# Patient Record
Sex: Male | Born: 1990 | Race: White | Hispanic: No | Marital: Married | Smoking: Current every day smoker
Health system: Southern US, Community
[De-identification: ages and names within clinical notes are randomized; demographics above are authoritative.]

## PROBLEM LIST (undated history)

## (undated) DIAGNOSIS — K219 Gastro-esophageal reflux disease without esophagitis: Secondary | ICD-10-CM

## (undated) DIAGNOSIS — F419 Anxiety disorder, unspecified: Secondary | ICD-10-CM

## (undated) DIAGNOSIS — F32A Depression, unspecified: Secondary | ICD-10-CM

## (undated) DIAGNOSIS — F1910 Other psychoactive substance abuse, uncomplicated: Secondary | ICD-10-CM

## (undated) HISTORY — PX: NO PAST SURGERIES: SHX2092

---

## 2013-09-26 ENCOUNTER — Emergency Department: Payer: Self-pay | Admitting: Emergency Medicine

## 2013-09-26 LAB — CBC
HCT: 47.8 % (ref 40.0–52.0)
HGB: 16.9 g/dL (ref 13.0–18.0)
MCH: 31.6 pg (ref 26.0–34.0)
MCHC: 35.4 g/dL (ref 32.0–36.0)
MCV: 89 fL (ref 80–100)
RBC: 5.36 10*6/uL (ref 4.40–5.90)
RDW: 13 % (ref 11.5–14.5)

## 2013-09-26 LAB — COMPREHENSIVE METABOLIC PANEL
Albumin: 4.2 g/dL (ref 3.4–5.0)
Anion Gap: 5 — ABNORMAL LOW (ref 7–16)
BUN: 14 mg/dL (ref 7–18)
Bilirubin,Total: 0.3 mg/dL (ref 0.2–1.0)
Calcium, Total: 9.4 mg/dL (ref 8.5–10.1)
Chloride: 106 mmol/L (ref 98–107)
Co2: 28 mmol/L (ref 21–32)
Creatinine: 1.07 mg/dL (ref 0.60–1.30)
EGFR (African American): >60
EGFR (Non-African Amer.): >60
Glucose: 111 mg/dL — ABNORMAL HIGH (ref 65–99)
Potassium: 3.6 mmol/L (ref 3.5–5.1)
Sodium: 139 mmol/L (ref 136–145)
Total Protein: 7.5 g/dL (ref 6.4–8.2)

## 2013-09-26 LAB — ETHANOL: Ethanol %: <0.003 % (ref 0.000–0.080)

## 2013-09-26 LAB — SALICYLATE LEVEL: Salicylates, Serum: 5.1 mg/dL — ABNORMAL HIGH

## 2018-06-12 ENCOUNTER — Other Ambulatory Visit: Payer: Self-pay

## 2018-06-12 DIAGNOSIS — Y929 Unspecified place or not applicable: Secondary | ICD-10-CM | POA: Insufficient documentation

## 2018-06-12 DIAGNOSIS — W228XXA Striking against or struck by other objects, initial encounter: Secondary | ICD-10-CM | POA: Insufficient documentation

## 2018-06-12 DIAGNOSIS — Z23 Encounter for immunization: Secondary | ICD-10-CM | POA: Insufficient documentation

## 2018-06-12 DIAGNOSIS — Y998 Other external cause status: Secondary | ICD-10-CM | POA: Insufficient documentation

## 2018-06-12 DIAGNOSIS — Y9389 Activity, other specified: Secondary | ICD-10-CM | POA: Insufficient documentation

## 2018-06-12 DIAGNOSIS — S61412A Laceration without foreign body of left hand, initial encounter: Secondary | ICD-10-CM | POA: Insufficient documentation
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
## 2018-06-13 ENCOUNTER — Emergency Department
Admission: EM | Admit: 2018-06-13 | Discharge: 2018-06-13 | Payer: Self-pay | Attending: Emergency Medicine | Admitting: Emergency Medicine

## 2018-06-13 ENCOUNTER — Emergency Department: Payer: Self-pay

## 2018-06-13 DIAGNOSIS — S61412A Laceration without foreign body of left hand, initial encounter: Secondary | ICD-10-CM

## 2018-06-13 MED ORDER — LIDOCAINE HCL (PF) 1 % IJ SOLN
INTRAMUSCULAR | Status: AC
  Filled 2018-06-13: qty 10

## 2018-06-13 MED ORDER — TETANUS-DIPHTH-ACELL PERTUSSIS 5-2.5-18.5 LF-MCG/0.5 IM SUSP
0.5000 mL | Freq: Once | INTRAMUSCULAR | Status: AC
Start: 2018-06-13 — End: 2018-06-13
  Administered 2018-06-13: 0.5 mL via INTRAMUSCULAR
  Filled 2018-06-13: qty 0.5

## 2018-06-13 MED ORDER — LIDOCAINE HCL (PF) 1 % IJ SOLN
30.0000 mL | Freq: Once | INTRAMUSCULAR | Status: AC
  Administered 2018-06-13: 30 mL via INTRADERMAL
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
## 2018-06-13 NOTE — Discharge Instructions (Addendum)
1.  Your tetanus was updated and will be good for the next 10 years. 2.  Suture removal in 7 to 10 days. 3.  Return to the ER for worsening symptoms, increased redness/swelling, purulent discharge or other concerns.
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
## 2018-10-27 DIAGNOSIS — U071 COVID-19: Secondary | ICD-10-CM

## 2018-10-27 HISTORY — DX: COVID-19: U07.1
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
## 2019-09-21 ENCOUNTER — Other Ambulatory Visit: Payer: Self-pay

## 2019-09-21 DIAGNOSIS — Z20822 Contact with and (suspected) exposure to covid-19: Secondary | ICD-10-CM

## 2019-09-23 LAB — NOVEL CORONAVIRUS, NAA: SARS-CoV-2, NAA: NOT DETECTED

## 2022-05-12 ENCOUNTER — Ambulatory Visit (INDEPENDENT_AMBULATORY_CARE_PROVIDER_SITE_OTHER): Payer: Medicaid Other

## 2022-05-12 ENCOUNTER — Ambulatory Visit
Admission: EM | Admit: 2022-05-12 | Discharge: 2022-05-12 | Disposition: A | Discharge status: Home / Self Care | Payer: Medicaid Other | Attending: Physician Assistant | Admitting: Physician Assistant

## 2022-05-12 DIAGNOSIS — R0781 Pleurodynia: Secondary | ICD-10-CM

## 2022-05-12 DIAGNOSIS — S2242XA Multiple fractures of ribs, left side, initial encounter for closed fracture: Secondary | ICD-10-CM
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
## 2022-05-12 NOTE — Discharge Instructions (Addendum)
-  You have fractures of the fifth, sixth and seventh ribs.  This generally heal on its own over a couple of weeks.  Sounds like they are already healing but it will probably be a couple more weeks until it heals. - Continue to ice the area and use the compressive wrap as well as take ibuprofen and Tylenol. - If pain worsens or you feel short of breath, you need to call 911 or have someone take you to the ER. - Follow-up with your PCP.
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
## 2022-10-07 ENCOUNTER — Emergency Department (HOSPITAL_COMMUNITY): Payer: Medicaid Other

## 2022-10-07 ENCOUNTER — Emergency Department (HOSPITAL_COMMUNITY)
Admission: EM | Admit: 2022-10-07 | Discharge: 2022-10-08 | Disposition: A | Discharge status: Home / Self Care | Payer: Medicaid Other | Attending: Emergency Medicine | Admitting: Emergency Medicine

## 2022-10-07 DIAGNOSIS — F10129 Alcohol abuse with intoxication, unspecified: Secondary | ICD-10-CM | POA: Insufficient documentation

## 2022-10-07 DIAGNOSIS — S42201A Unspecified fracture of upper end of right humerus, initial encounter for closed fracture: Secondary | ICD-10-CM | POA: Insufficient documentation

## 2022-10-07 DIAGNOSIS — Y92410 Unspecified street and highway as the place of occurrence of the external cause: Secondary | ICD-10-CM | POA: Insufficient documentation

## 2022-10-07 DIAGNOSIS — Y905 Blood alcohol level of 100-119 mg/100 ml: Secondary | ICD-10-CM | POA: Diagnosis not present

## 2022-10-07 DIAGNOSIS — S2231XA Fracture of one rib, right side, initial encounter for closed fracture: Secondary | ICD-10-CM | POA: Diagnosis not present

## 2022-10-07 DIAGNOSIS — S32592A Other specified fracture of left pubis, initial encounter for closed fracture: Secondary | ICD-10-CM | POA: Diagnosis not present

## 2022-10-07 DIAGNOSIS — Z23 Encounter for immunization: Secondary | ICD-10-CM | POA: Insufficient documentation

## 2022-10-07 DIAGNOSIS — Y9355 Activity, bike riding: Secondary | ICD-10-CM | POA: Insufficient documentation

## 2022-10-07 DIAGNOSIS — S0990XA Unspecified injury of head, initial encounter: Secondary | ICD-10-CM | POA: Insufficient documentation

## 2022-10-07 DIAGNOSIS — S4991XA Unspecified injury of right shoulder and upper arm, initial encounter: Secondary | ICD-10-CM | POA: Diagnosis present

## 2022-10-07 DIAGNOSIS — S42309A Unspecified fracture of shaft of humerus, unspecified arm, initial encounter for closed fracture: Secondary | ICD-10-CM | POA: Diagnosis present

## 2022-10-07 DIAGNOSIS — F17210 Nicotine dependence, cigarettes, uncomplicated: Secondary | ICD-10-CM | POA: Diagnosis not present

## 2022-10-07 LAB — URINALYSIS, ROUTINE W REFLEX MICROSCOPIC
Bilirubin Urine: NEGATIVE
Glucose, UA: NEGATIVE mg/dL
Hgb urine dipstick: NEGATIVE
Ketones, ur: NEGATIVE mg/dL
Leukocytes,Ua: NEGATIVE
Nitrite: NEGATIVE
Protein, ur: NEGATIVE mg/dL
Specific Gravity, Urine: 1.027 (ref 1.005–1.030)
pH: 5 (ref 5.0–8.0)

## 2022-10-07 LAB — CBC
HCT: 42.2 % (ref 39.0–52.0)
Hemoglobin: 15.2 g/dL (ref 13.0–17.0)
MCH: 33.5 pg (ref 26.0–34.0)
MCHC: 36 g/dL (ref 30.0–36.0)
MCV: 93 fL (ref 80.0–100.0)
Platelets: 289 10*3/uL (ref 150–400)
RBC: 4.54 MIL/uL (ref 4.22–5.81)
RDW: 11.8 % (ref 11.5–15.5)
WBC: 10.1 10*3/uL (ref 4.0–10.5)
nRBC: 0 % (ref 0.0–0.2)

## 2022-10-07 LAB — COMPREHENSIVE METABOLIC PANEL
ALT: 55 U/L — ABNORMAL HIGH (ref 0–44)
AST: 53 U/L — ABNORMAL HIGH (ref 15–41)
Albumin: 3.5 g/dL (ref 3.5–5.0)
Alkaline Phosphatase: 93 U/L (ref 38–126)
Anion gap: 16 — ABNORMAL HIGH (ref 5–15)
BUN: 11 mg/dL (ref 6–20)
CO2: 20 mmol/L — ABNORMAL LOW (ref 22–32)
Calcium: 9.1 mg/dL (ref 8.9–10.3)
Chloride: 99 mmol/L (ref 98–111)
Creatinine, Ser: 0.95 mg/dL (ref 0.61–1.24)
GFR, Estimated: >60 mL/min (ref >60)
Glucose, Bld: 134 mg/dL — ABNORMAL HIGH (ref 70–99)
Potassium: 3.3 mmol/L — ABNORMAL LOW (ref 3.5–5.1)
Sodium: 135 mmol/L (ref 135–145)
Total Bilirubin: 0.5 mg/dL (ref 0.3–1.2)
Total Protein: 6.6 g/dL (ref 6.5–8.1)

## 2022-10-07 LAB — I-STAT CHEM 8, ED
BUN: 11 mg/dL (ref 6–20)
Calcium, Ion: 1.16 mmol/L (ref 1.15–1.40)
Chloride: 98 mmol/L (ref 98–111)
Creatinine, Ser: 1.1 mg/dL (ref 0.61–1.24)
Glucose, Bld: 128 mg/dL — ABNORMAL HIGH (ref 70–99)
HCT: 44 % (ref 39.0–52.0)
Hemoglobin: 15 g/dL (ref 13.0–17.0)
Potassium: 3.3 mmol/L — ABNORMAL LOW (ref 3.5–5.1)
Sodium: 136 mmol/L (ref 135–145)
TCO2: 24 mmol/L (ref 22–32)

## 2022-10-07 LAB — LACTIC ACID, PLASMA: Lactic Acid, Venous: 2.9 mmol/L (ref 0.5–1.9)

## 2022-10-07 LAB — TYPE AND SCREEN
ABO/RH(D): A POS
Antibody Screen: NEGATIVE

## 2022-10-07 LAB — PROTIME-INR
INR: 1 (ref 0.8–1.2)
Prothrombin Time: 13.2 seconds (ref 11.4–15.2)

## 2022-10-07 LAB — ETHANOL: Alcohol, Ethyl (B): 113 mg/dL — ABNORMAL HIGH (ref <10)

## 2022-10-07 MED ORDER — HYDROMORPHONE HCL 1 MG/ML IJ SOLN
0.5000 mg | Freq: Once | INTRAMUSCULAR | Status: AC
  Administered 2022-10-07: 0.5 mg via INTRAVENOUS
  Filled 2022-10-07: qty 1

## 2022-10-07 MED ORDER — LACTATED RINGERS IV BOLUS
1000.0000 mL | Freq: Once | INTRAVENOUS | Status: AC
  Administered 2022-10-07: 1000 mL via INTRAVENOUS

## 2022-10-07 MED ORDER — METOPROLOL TARTRATE 5 MG/5ML IV SOLN: 5.0000 mg | Freq: Four times a day (QID) | INTRAVENOUS | Status: DC | PRN

## 2022-10-07 MED ORDER — ONDANSETRON 4 MG PO TBDP: 4.0000 mg | ORAL_TABLET | Freq: Four times a day (QID) | ORAL | Status: DC | PRN

## 2022-10-07 MED ORDER — METHOCARBAMOL 500 MG PO TABS
500.0000 mg | ORAL_TABLET | Freq: Four times a day (QID) | ORAL | Status: DC | PRN
  Administered 2022-10-07: 500 mg via ORAL
  Filled 2022-10-07: qty 1

## 2022-10-07 MED ORDER — GABAPENTIN 300 MG PO CAPS
300.0000 mg | ORAL_CAPSULE | Freq: Three times a day (TID) | ORAL | Status: DC
  Administered 2022-10-07 – 2022-10-08 (×3): 300 mg via ORAL
  Filled 2022-10-07 (×3): qty 1

## 2022-10-07 MED ORDER — BISACODYL 10 MG RE SUPP: 10.0000 mg | Freq: Every day | RECTAL | Status: DC | PRN

## 2022-10-07 MED ORDER — ACETAMINOPHEN 500 MG PO TABS
1000.0000 mg | ORAL_TABLET | Freq: Four times a day (QID) | ORAL | Status: DC
  Administered 2022-10-07 – 2022-10-08 (×4): 1000 mg via ORAL
  Filled 2022-10-07 (×4): qty 2

## 2022-10-07 MED ORDER — HYDRALAZINE HCL 20 MG/ML IJ SOLN: 10.0000 mg | INTRAMUSCULAR | Status: DC | PRN

## 2022-10-07 MED ORDER — FENTANYL CITRATE PF 50 MCG/ML IJ SOSY
100.0000 ug | PREFILLED_SYRINGE | Freq: Once | INTRAMUSCULAR | Status: AC
  Administered 2022-10-07: 100 ug via INTRAVENOUS
  Filled 2022-10-07: qty 2

## 2022-10-07 MED ORDER — IOHEXOL 350 MG/ML SOLN
75.0000 mL | Freq: Once | INTRAVENOUS | Status: AC | PRN
  Administered 2022-10-07: 75 mL via INTRAVENOUS

## 2022-10-07 MED ORDER — TETANUS-DIPHTH-ACELL PERTUSSIS 5-2.5-18.5 LF-MCG/0.5 IM SUSY
0.5000 mL | PREFILLED_SYRINGE | Freq: Once | INTRAMUSCULAR | Status: AC
  Administered 2022-10-07: 0.5 mL via INTRAMUSCULAR
  Filled 2022-10-07: qty 0.5

## 2022-10-07 MED ORDER — DOCUSATE SODIUM 100 MG PO CAPS
100.0000 mg | ORAL_CAPSULE | Freq: Two times a day (BID) | ORAL | Status: DC
  Administered 2022-10-07 – 2022-10-08 (×2): 100 mg via ORAL
  Filled 2022-10-07 (×2): qty 1

## 2022-10-07 MED ORDER — IBUPROFEN 800 MG PO TABS
800.0000 mg | ORAL_TABLET | Freq: Three times a day (TID) | ORAL | Status: DC
  Administered 2022-10-07 – 2022-10-08 (×3): 800 mg via ORAL
  Filled 2022-10-07 (×3): qty 1

## 2022-10-07 MED ORDER — ENOXAPARIN SODIUM 30 MG/0.3ML IJ SOSY: 30.0000 mg | PREFILLED_SYRINGE | Freq: Two times a day (BID) | INTRAMUSCULAR | Status: DC

## 2022-10-07 MED ORDER — ONDANSETRON HCL 4 MG/2ML IJ SOLN
4.0000 mg | Freq: Once | INTRAMUSCULAR | Status: AC
  Administered 2022-10-07: 4 mg via INTRAVENOUS
  Filled 2022-10-07: qty 2

## 2022-10-07 MED ORDER — THIAMINE HCL 100 MG/ML IJ SOLN
100.0000 mg | Freq: Once | INTRAMUSCULAR | Status: AC
  Administered 2022-10-07: 100 mg via INTRAVENOUS
  Filled 2022-10-07: qty 2

## 2022-10-07 MED ORDER — BACITRACIN ZINC 500 UNIT/GM EX OINT
TOPICAL_OINTMENT | Freq: Two times a day (BID) | CUTANEOUS | Status: DC
  Administered 2022-10-07 – 2022-10-08 (×2): 1 via TOPICAL
  Filled 2022-10-07: qty 0.9

## 2022-10-07 MED ORDER — ONDANSETRON HCL 4 MG/2ML IJ SOLN: 4.0000 mg | Freq: Four times a day (QID) | INTRAMUSCULAR | Status: DC | PRN

## 2022-10-07 MED ORDER — HYDROMORPHONE HCL 1 MG/ML IJ SOLN
0.5000 mg | INTRAMUSCULAR | Status: DC | PRN
  Administered 2022-10-08: 0.5 mg via INTRAVENOUS
  Filled 2022-10-07: qty 1

## 2022-10-07 MED ORDER — SODIUM CHLORIDE 0.9 % IV SOLN: INTRAVENOUS | Status: DC

## 2022-10-07 MED ORDER — OXYCODONE HCL 5 MG PO TABS
5.0000 mg | ORAL_TABLET | ORAL | Status: DC | PRN
  Administered 2022-10-07 – 2022-10-08 (×3): 5 mg via ORAL
  Filled 2022-10-07 (×3): qty 1
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
## 2022-10-07 NOTE — ED Notes (Signed)
Ortho tech is coming to place shoulder sling/immobilizer.
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
## 2022-10-07 NOTE — Progress Notes (Signed)
Orthopedic Tech Progress Note Patient Details:  Alejandro Cooper 1991-09-16 [id number]  Ortho Devices Type of Ortho Device: Sling immobilizer Ortho Device/Splint Location: RUE Ortho Device/Splint Interventions: Ordered, Application   Post Interventions Patient Tolerated: Well  Alejandro Cooper 10/07/2022, 4:56 PM
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
## 2022-10-07 NOTE — ED Provider Notes (Signed)
Ultrasound ED FAST  Date/Time: 10/07/2022 2:41 PM  Performed by: Jeannie Fend, PA-C Authorized by: Jeannie Fend, PA-C  Procedure details:    Indications: blunt abdominal trauma        Technique:  Abdominal    Images: not archived      Abdominal findings:    L kidney:  Visualized   R kidney:  Visualized   Liver:  Visualized    Bladder:  Visualized   Hepatorenal space visualized: not identified     Splenorenal space: not identified     Rectovesical free fluid: not identified     Splenorenal free fluid: not identified     Hepatorenal space free fluid: not identified       Jeannie Fend, PA-C 10/07/22 1442    Wynetta Fines, MD 10/07/22 1521

## 2022-10-08 ENCOUNTER — Observation Stay (HOSPITAL_COMMUNITY): Payer: Medicaid Other

## 2022-10-08 LAB — BASIC METABOLIC PANEL
Anion gap: 8 (ref 5–15)
BUN: 7 mg/dL (ref 6–20)
CO2: 25 mmol/L (ref 22–32)
Calcium: 8.4 mg/dL — ABNORMAL LOW (ref 8.9–10.3)
Chloride: 102 mmol/L (ref 98–111)
Creatinine, Ser: 0.81 mg/dL (ref 0.61–1.24)
GFR, Estimated: >60 mL/min (ref >60)
Glucose, Bld: 107 mg/dL — ABNORMAL HIGH (ref 70–99)
Potassium: 3.2 mmol/L — ABNORMAL LOW (ref 3.5–5.1)
Sodium: 135 mmol/L (ref 135–145)

## 2022-10-08 LAB — CBC
HCT: 38.1 % — ABNORMAL LOW (ref 39.0–52.0)
Hemoglobin: 13.5 g/dL (ref 13.0–17.0)
MCH: 32.9 pg (ref 26.0–34.0)
MCHC: 35.4 g/dL (ref 30.0–36.0)
MCV: 92.9 fL (ref 80.0–100.0)
Platelets: 248 10*3/uL (ref 150–400)
RBC: 4.1 MIL/uL — ABNORMAL LOW (ref 4.22–5.81)
RDW: 11.6 % (ref 11.5–15.5)
WBC: 6.8 10*3/uL (ref 4.0–10.5)
nRBC: 0 % (ref 0.0–0.2)

## 2022-10-08 LAB — HIV ANTIBODY (ROUTINE TESTING W REFLEX): HIV Screen 4th Generation wRfx: NONREACTIVE

## 2022-10-08 LAB — ABO/RH: ABO/RH(D): A POS

## 2022-10-08 MED ORDER — POTASSIUM CHLORIDE 10 MEQ/100ML IV SOLN
10.0000 meq | INTRAVENOUS | Status: AC
  Administered 2022-10-08 (×4): 10 meq via INTRAVENOUS
  Filled 2022-10-08 (×4): qty 100

## 2022-10-08 MED ORDER — OXYCODONE HCL 5 MG PO TABS: 5.0000 mg | ORAL_TABLET | Freq: Four times a day (QID) | ORAL | 0 refills | Status: DC | PRN

## 2022-10-08 MED ORDER — ACETAMINOPHEN 500 MG PO TABS: 1000.0000 mg | ORAL_TABLET | Freq: Four times a day (QID) | ORAL | 0 refills | Status: AC | PRN
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
## 2022-10-08 NOTE — ED Notes (Signed)
Reviewed discharge instructions with patient. Follow-up care and medications reviewed. Patient verbalized understanding. Patient A&Ox4, VSS upon discharge. 

## 2022-10-22 ENCOUNTER — Ambulatory Visit: Payer: Self-pay | Admitting: Student

## 2022-10-23 ENCOUNTER — Encounter (HOSPITAL_COMMUNITY): Payer: Self-pay | Admitting: Student

## 2022-10-23 ENCOUNTER — Other Ambulatory Visit: Payer: Self-pay

## 2022-10-23 NOTE — Progress Notes (Signed)
Spoke with pt's wife, Lelon Mast for pre-op call. She states pt does not have a cardiac history, HTN or Diabetes.   Shower instructions given to [place] and she voices understanding.

## 2022-10-24 ENCOUNTER — Ambulatory Visit (HOSPITAL_BASED_OUTPATIENT_CLINIC_OR_DEPARTMENT_OTHER): Payer: Medicaid Other | Admitting: Anesthesiology

## 2022-10-24 ENCOUNTER — Ambulatory Visit (HOSPITAL_COMMUNITY): Payer: Medicaid Other | Admitting: Anesthesiology

## 2022-10-24 ENCOUNTER — Encounter (HOSPITAL_COMMUNITY): Payer: Self-pay | Admitting: Student

## 2022-10-24 ENCOUNTER — Other Ambulatory Visit: Payer: Self-pay

## 2022-10-24 ENCOUNTER — Ambulatory Visit (HOSPITAL_COMMUNITY): Payer: Medicaid Other

## 2022-10-24 ENCOUNTER — Encounter (HOSPITAL_COMMUNITY)
Admission: RE | Disposition: A | Discharge status: Home / Self Care | Payer: Self-pay | Source: Home / Self Care | Attending: Student

## 2022-10-24 ENCOUNTER — Ambulatory Visit (HOSPITAL_COMMUNITY)
Admission: RE | Admit: 2022-10-24 | Discharge: 2022-10-24 | Disposition: A | Discharge status: Home / Self Care | Payer: Medicaid Other | Attending: Student | Admitting: Student

## 2022-10-24 DIAGNOSIS — F17210 Nicotine dependence, cigarettes, uncomplicated: Secondary | ICD-10-CM

## 2022-10-24 DIAGNOSIS — S32592A Other specified fracture of left pubis, initial encounter for closed fracture: Secondary | ICD-10-CM | POA: Diagnosis not present

## 2022-10-24 DIAGNOSIS — S42201A Unspecified fracture of upper end of right humerus, initial encounter for closed fracture: Secondary | ICD-10-CM | POA: Diagnosis not present

## 2022-10-24 DIAGNOSIS — K219 Gastro-esophageal reflux disease without esophagitis: Secondary | ICD-10-CM | POA: Insufficient documentation

## 2022-10-24 HISTORY — DX: Gastro-esophageal reflux disease without esophagitis: K21.9

## 2022-10-24 HISTORY — PX: ORIF HUMERUS FRACTURE: SHX2126

## 2022-10-24 HISTORY — DX: Other psychoactive substance abuse, uncomplicated: F19.10

## 2022-10-24 HISTORY — DX: Anxiety disorder, unspecified: F41.9

## 2022-10-24 HISTORY — DX: Depression, unspecified: F32.A

## 2022-10-24 SURGERY — OPEN REDUCTION INTERNAL FIXATION (ORIF) PROXIMAL HUMERUS FRACTURE
Anesthesia: General | Site: Arm Upper | Laterality: Right

## 2022-10-24 MED ORDER — HYDROCODONE-ACETAMINOPHEN 7.5-325 MG PO TABS: 1.0000 | ORAL_TABLET | ORAL | 0 refills | Status: DC | PRN

## 2022-10-24 MED ORDER — LIDOCAINE 2% (20 MG/ML) 5 ML SYRINGE
INTRAMUSCULAR | Status: DC | PRN
  Administered 2022-10-24: 100 mg via INTRAVENOUS

## 2022-10-24 MED ORDER — OXYCODONE HCL 5 MG/5ML PO SOLN: 5.0000 mg | Freq: Once | ORAL | Status: AC | PRN

## 2022-10-24 MED ORDER — BUPIVACAINE HCL (PF) 0.5 % IJ SOLN
INTRAMUSCULAR | Status: DC | PRN
  Administered 2022-10-24: 20 mL via PERINEURAL

## 2022-10-24 MED ORDER — MIDAZOLAM HCL 2 MG/2ML IJ SOLN: 2.0000 mg | Freq: Once | INTRAMUSCULAR | Status: AC

## 2022-10-24 MED ORDER — 0.9 % SODIUM CHLORIDE (POUR BTL) OPTIME
TOPICAL | Status: DC | PRN
  Administered 2022-10-24: 1000 mL

## 2022-10-24 MED ORDER — KETOROLAC TROMETHAMINE 30 MG/ML IJ SOLN
INTRAMUSCULAR | Status: AC
  Filled 2022-10-24: qty 1

## 2022-10-24 MED ORDER — SUGAMMADEX SODIUM 200 MG/2ML IV SOLN
INTRAVENOUS | Status: DC | PRN
  Administered 2022-10-24: 200 mg via INTRAVENOUS

## 2022-10-24 MED ORDER — FENTANYL CITRATE (PF) 100 MCG/2ML IJ SOLN
INTRAMUSCULAR | Status: AC
  Administered 2022-10-24: 100 ug via INTRAVENOUS
  Filled 2022-10-24: qty 2

## 2022-10-24 MED ORDER — LIDOCAINE 2% (20 MG/ML) 5 ML SYRINGE
INTRAMUSCULAR | Status: AC
  Filled 2022-10-24: qty 5

## 2022-10-24 MED ORDER — HYDROMORPHONE HCL 1 MG/ML IJ SOLN: 0.2500 mg | INTRAMUSCULAR | Status: DC | PRN

## 2022-10-24 MED ORDER — METHOCARBAMOL 500 MG PO TABS: 500.0000 mg | ORAL_TABLET | Freq: Four times a day (QID) | ORAL | 0 refills | Status: AC | PRN

## 2022-10-24 MED ORDER — FENTANYL CITRATE (PF) 250 MCG/5ML IJ SOLN
INTRAMUSCULAR | Status: AC
  Filled 2022-10-24: qty 5

## 2022-10-24 MED ORDER — ORAL CARE MOUTH RINSE: 15.0000 mL | Freq: Once | OROMUCOSAL | Status: AC

## 2022-10-24 MED ORDER — KETOROLAC TROMETHAMINE 30 MG/ML IJ SOLN
30.0000 mg | Freq: Once | INTRAMUSCULAR | Status: AC | PRN
  Administered 2022-10-24: 30 mg via INTRAVENOUS

## 2022-10-24 MED ORDER — MIDAZOLAM HCL 2 MG/2ML IJ SOLN
INTRAMUSCULAR | Status: DC | PRN
  Administered 2022-10-24: 2 mg via INTRAVENOUS

## 2022-10-24 MED ORDER — VANCOMYCIN HCL 1000 MG IV SOLR
INTRAVENOUS | Status: DC | PRN
  Administered 2022-10-24: 1000 mg via TOPICAL

## 2022-10-24 MED ORDER — MIDAZOLAM HCL 2 MG/2ML IJ SOLN
INTRAMUSCULAR | Status: AC
  Administered 2022-10-24: 2 mg via INTRAVENOUS
  Filled 2022-10-24: qty 2

## 2022-10-24 MED ORDER — PROPOFOL 10 MG/ML IV BOLUS
INTRAVENOUS | Status: AC
  Filled 2022-10-24: qty 20

## 2022-10-24 MED ORDER — FENTANYL CITRATE (PF) 100 MCG/2ML IJ SOLN: 100.0000 ug | Freq: Once | INTRAMUSCULAR | Status: AC

## 2022-10-24 MED ORDER — OXYCODONE HCL 5 MG PO TABS
5.0000 mg | ORAL_TABLET | Freq: Once | ORAL | Status: AC | PRN
  Administered 2022-10-24: 5 mg via ORAL

## 2022-10-24 MED ORDER — DEXAMETHASONE SODIUM PHOSPHATE 10 MG/ML IJ SOLN
INTRAMUSCULAR | Status: DC | PRN
  Administered 2022-10-24: 10 mg via INTRAVENOUS

## 2022-10-24 MED ORDER — OXYCODONE HCL 5 MG PO TABS
ORAL_TABLET | ORAL | Status: AC
  Filled 2022-10-24: qty 1

## 2022-10-24 MED ORDER — FENTANYL CITRATE (PF) 250 MCG/5ML IJ SOLN
INTRAMUSCULAR | Status: DC | PRN
  Administered 2022-10-24: 50 ug via INTRAVENOUS
  Administered 2022-10-24: 100 ug via INTRAVENOUS
  Administered 2022-10-24: 50 ug via INTRAVENOUS

## 2022-10-24 MED ORDER — BUPIVACAINE LIPOSOME 1.3 % IJ SUSP
INTRAMUSCULAR | Status: DC | PRN
  Administered 2022-10-24: 10 mL via PERINEURAL

## 2022-10-24 MED ORDER — VANCOMYCIN HCL 1000 MG IV SOLR
INTRAVENOUS | Status: AC
  Filled 2022-10-24: qty 20

## 2022-10-24 MED ORDER — ROCURONIUM BROMIDE 10 MG/ML (PF) SYRINGE
PREFILLED_SYRINGE | INTRAVENOUS | Status: DC | PRN
  Administered 2022-10-24: 70 mg via INTRAVENOUS
  Administered 2022-10-24: 30 mg via INTRAVENOUS

## 2022-10-24 MED ORDER — PROPOFOL 10 MG/ML IV BOLUS
INTRAVENOUS | Status: DC | PRN
  Administered 2022-10-24: 150 mg via INTRAVENOUS

## 2022-10-24 MED ORDER — CEFAZOLIN SODIUM-DEXTROSE 2-4 GM/100ML-% IV SOLN
2.0000 g | INTRAVENOUS | Status: AC
  Administered 2022-10-24: 2 g via INTRAVENOUS
  Filled 2022-10-24: qty 100

## 2022-10-24 MED ORDER — DEXMEDETOMIDINE HCL IN NACL 80 MCG/20ML IV SOLN
INTRAVENOUS | Status: DC | PRN
  Administered 2022-10-24: 52 ug via BUCCAL

## 2022-10-24 MED ORDER — MIDAZOLAM HCL 2 MG/2ML IJ SOLN
INTRAMUSCULAR | Status: AC
  Filled 2022-10-24: qty 2

## 2022-10-24 MED ORDER — SUCCINYLCHOLINE CHLORIDE 200 MG/10ML IV SOSY
PREFILLED_SYRINGE | INTRAVENOUS | Status: AC
  Filled 2022-10-24: qty 10

## 2022-10-24 MED ORDER — ONDANSETRON HCL 4 MG/2ML IJ SOLN
INTRAMUSCULAR | Status: DC | PRN
  Administered 2022-10-24: 4 mg via INTRAVENOUS

## 2022-10-24 MED ORDER — ONDANSETRON HCL 4 MG/2ML IJ SOLN: 4.0000 mg | Freq: Once | INTRAMUSCULAR | Status: DC | PRN

## 2022-10-24 MED ORDER — CHLORHEXIDINE GLUCONATE 0.12 % MT SOLN
15.0000 mL | Freq: Once | OROMUCOSAL | Status: AC
  Administered 2022-10-24: 15 mL via OROMUCOSAL
  Filled 2022-10-24: qty 15

## 2022-10-24 MED ORDER — LACTATED RINGERS IV SOLN: INTRAVENOUS | Status: DC

## 2022-10-24 SURGICAL SUPPLY — 61 items
BAG COUNTER SPONGE SURGICOUNT (BAG) ×1 IMPLANT
BIT DRILL 3.2 (BIT)
BIT DRILL 3.2XCALB NS DISP (BIT) IMPLANT
BIT DRILL CALIBRATED 2.7 (BIT) IMPLANT
BIT DRL 3.2XCALB NS DISP (BIT)
BNDG ELASTIC 6X5.8 VLCR STR LF (GAUZE/BANDAGES/DRESSINGS) ×1 IMPLANT
BRUSH SCRUB EZ PLAIN DRY (MISCELLANEOUS) ×2 IMPLANT
CHLORAPREP W/TINT 26 (MISCELLANEOUS) ×1 IMPLANT
COVER SURGICAL LIGHT HANDLE (MISCELLANEOUS) ×2 IMPLANT
DERMABOND ADVANCED .7 DNX12 (GAUZE/BANDAGES/DRESSINGS) IMPLANT
DRAPE C-ARM 42X72 X-RAY (DRAPES) ×1 IMPLANT
DRAPE HALF SHEET 40X57 (DRAPES) ×1 IMPLANT
DRAPE SURG 17X23 STRL (DRAPES) ×2 IMPLANT
DRAPE U-SHAPE 47X51 STRL (DRAPES) ×2 IMPLANT
DRSG MEPILEX BORDER 4X8 (GAUZE/BANDAGES/DRESSINGS) ×1 IMPLANT
DRSG MEPILEX POST OP 4X8 (GAUZE/BANDAGES/DRESSINGS) IMPLANT
ELECT REM PT RETURN 9FT ADLT (ELECTROSURGICAL) ×1
ELECTRODE REM PT RTRN 9FT ADLT (ELECTROSURGICAL) ×1 IMPLANT
GLOVE BIO SURGEON STRL SZ 6.5 (GLOVE) ×3 IMPLANT
GLOVE BIO SURGEON STRL SZ7.5 (GLOVE) ×3 IMPLANT
GLOVE BIOGEL PI IND STRL 6.5 (GLOVE) ×1 IMPLANT
GLOVE BIOGEL PI IND STRL 7.5 (GLOVE) ×1 IMPLANT
GOWN STRL REUS W/ TWL LRG LVL3 (GOWN DISPOSABLE) ×2 IMPLANT
GOWN STRL REUS W/TWL LRG LVL3 (GOWN DISPOSABLE) ×2
HOOK DEPTH GAUGE SHLD ZI (ORTHOPEDIC DISPOSABLE SUPPLIES) IMPLANT
K-WIRE 2X5 SS THRDED S3 (WIRE) ×3
KIT BASIN OR (CUSTOM PROCEDURE TRAY) ×1 IMPLANT
KIT TURNOVER KIT B (KITS) ×1 IMPLANT
KWIRE 2X5 SS THRDED S3 (WIRE) IMPLANT
MANIFOLD NEPTUNE II (INSTRUMENTS) ×1 IMPLANT
NDL SUT 6 .5 CRC .975X.05 MAYO (NEEDLE) IMPLANT
NEEDLE MAYO TAPER (NEEDLE) ×1
NS IRRIG 1000ML POUR BTL (IV SOLUTION) ×1 IMPLANT
PACK SHOULDER (CUSTOM PROCEDURE TRAY) ×1 IMPLANT
PAD ARMBOARD 7.5X6 YLW CONV (MISCELLANEOUS) ×2 IMPLANT
PLATE 3HOLE HUMERUS PROX RT (Plate) IMPLANT
SCREW CORTICAL LOW PROF 3.5X32 (Screw) IMPLANT
SCREW LOCK CORT STAR 3.5X42 (Screw) IMPLANT
SCREW LOCK CORT STAR 3.5X44 (Screw) IMPLANT
SCREW LOCK CORT STAR 3.5X46 (Screw) IMPLANT
SCREW LOCK CORT STAR 3.5X54 (Screw) IMPLANT
SCREW LOCK CORT STAR 3.5X56 (Screw) IMPLANT
SCREW T15 MD 3.5X40MM NS (Screw) IMPLANT
SCREW T15 MD 3.5X48MM NS (Screw) IMPLANT
SLEEVE MEASURING 3.2 (BIT) IMPLANT
SLING ARM FOAM STRAP LRG (SOFTGOODS) IMPLANT
SPONGE T-LAP 18X18 ~~LOC~~+RFID (SPONGE) ×1 IMPLANT
STAPLER VISISTAT 35W (STAPLE) ×1 IMPLANT
SUCTION FRAZIER HANDLE 10FR (MISCELLANEOUS) ×1
SUCTION TUBE FRAZIER 10FR DISP (MISCELLANEOUS) ×1 IMPLANT
SUT ETHILON 3 0 FSL (SUTURE) IMPLANT
SUT FIBERWIRE #2 38 T-5 BLUE (SUTURE) ×2
SUT MNCRL AB 3-0 PS2 18 (SUTURE) ×1 IMPLANT
SUT VIC AB 0 CT1 27 (SUTURE) ×2
SUT VIC AB 0 CT1 27XBRD ANBCTR (SUTURE) ×2 IMPLANT
SUT VIC AB 2-0 CT1 27 (SUTURE) ×2
SUT VIC AB 2-0 CT1 TAPERPNT 27 (SUTURE) ×2 IMPLANT
SUTURE FIBERWR #2 38 T-5 BLUE (SUTURE) ×2 IMPLANT
TOWEL GREEN STERILE (TOWEL DISPOSABLE) ×2 IMPLANT
TRAY FOLEY MTR SLVR 16FR STAT (SET/KITS/TRAYS/PACK) IMPLANT
WATER STERILE IRR 1000ML POUR (IV SOLUTION) ×1 IMPLANT

## 2022-10-24 NOTE — Anesthesia Procedure Notes (Signed)
Anesthesia Regional Block: Interscalene brachial plexus block   Pre-Anesthetic Checklist: , timeout performed,  Correct Patient, Correct Site, Correct Laterality,  Correct Procedure, Correct Position, site marked,  Risks and benefits discussed,  Surgical consent,  Pre-op evaluation,  At surgeon's request and post-op pain management  Laterality: Right  Prep: chloraprep       Needles:  Injection technique: Single-shot  Needle Type: Echogenic Stimulator Needle     Needle Length: 9cm      Additional Needles:   Procedures:,,,, ultrasound used (permanent image in chart),,     Nerve Stimulator or Paresthesia:  Response: 0.5 mA  Additional Responses:   Narrative:  Start time: 10/24/2022 9:18 AM End time: 10/24/2022 9:28 AM Injection made incrementally with aspirations every 5 mL.  Performed by: Personally  Anesthesiologist: Eilene Ghazi, MD  Additional Notes: Patient tolerated the procedure well without complications

## 2022-10-24 NOTE — Transfer of Care (Signed)
Immediate Anesthesia Transfer of Care Note  Patient: Alejandro Cooper  Procedure(s) Performed: OPEN REDUCTION INTERNAL FIXATION (ORIF) OF RIGHT PROXIMAL HUMERUS (Right: Arm Upper)  Patient Location: PACU  Anesthesia Type:General  Level of Consciousness: drowsy and patient cooperative  Airway & Oxygen Therapy: Patient Spontanous Breathing and Patient connected to nasal cannula oxygen  Post-op Assessment: Report given to RN and Post -op Vital signs reviewed and stable  Post vital signs: Reviewed and stable  Last Vitals:  Vitals Value Taken Time  BP 126/86 10/24/22 1142  Temp    Pulse 93 10/24/22 1143  Resp 19 10/24/22 1143  SpO2 91 % 10/24/22 1143  Vitals shown include unvalidated device data.  Last Pain:  Vitals:   10/24/22 0833  PainSc: 4       Patients Stated Pain Goal: 3 (10/24/22 1834)  Complications: No notable events documented.

## 2022-10-24 NOTE — Anesthesia Procedure Notes (Signed)
Procedure Name: Intubation Date/Time: 10/24/2022 10:00 AM  Performed by: Rosiland Oz, CRNAPre-anesthesia Checklist: Patient identified, Emergency Drugs available, Suction available, Patient being monitored and Timeout performed Patient Re-evaluated:Patient Re-evaluated prior to induction Oxygen Delivery Method: Circle system utilized Preoxygenation: Pre-oxygenation with 100% oxygen Induction Type: IV induction Ventilation: Mask ventilation without difficulty Laryngoscope Size: Miller and 3 Grade View: Grade I Tube type: Oral Tube size: 7.5 mm Number of attempts: 1 Airway Equipment and Method: Stylet Placement Confirmation: ETT inserted through vocal cords under direct vision, positive ETCO2 and breath sounds checked- equal and bilateral Secured at: 21 cm Tube secured with: Tape Dental Injury: Teeth and Oropharynx as per pre-operative assessment

## 2022-10-24 NOTE — Discharge Instructions (Addendum)
Cooper Merle, MD Alejandro Southward PA-C [facility] [address] [phone] [name])   [phone] (fax)                                  POST-OPERATIVE INSTRUCTIONS   WEIGHT BEARING STATUS: Non-weightbearing right upper extremity  RANGE OF MOTION/ACTIVITY: ok for gentle shoulder range of motion as tolerated  WOUND CARE Please incision covered for at least 48 hours. You may remove your dressing on Post-Op Day #2. You incision may be left open to the air at this time. If you feel itchy take Benadryl as prescribed on the bottle for itching You may shower on Post-Op Day #3. Incisions may begin getting wet at this time, clean around incisions gently with soap and water  EXERCISES You may come out of your sling and begin some gentle shoulder and elbow range of motion as you can tolerated. Please continue to work on range of motion of your fingers and stretch these multiple times a day to prevent stiffness. Please continue to ambulate and do not stay sitting or lying for too long. Perform foot and wrist pumps to assist in circulation.  DVT/PE prophylaxis: None  DIET: As you were eating previously.  Can use over the counter stool softeners and bowel preparations, such as Miralax, to help with bowel movements.  Narcotics can be constipating.  Be sure to drink plenty of fluids  REGIONAL ANESTHESIA (NERVE BLOCKS) The anesthesia team may have performed a nerve block for you if safe in the setting of your care.  This is a great tool used to minimize pain.  Typically the block may start wearing off overnight but the long acting medicine may last for 3-4 days.  The nerve block wearing off can be a challenging period but please utilize your as needed pain medications to try and manage this period.    ICE AND ELEVATE INJURED/OPERATIVE EXTREMITY Using ice and elevating the injured extremity above your heart can help with swelling and pain control.  Icing in a pulsatile fashion,  such as 20 minutes on and 20 minutes off, can be followed.   Do not place ice directly on skin. Make sure there is a barrier between to skin and the ice pack.   Using frozen items such as frozen peas works well as the conform nicely to the are that needs to be iced.  POST-OP MEDICATIONS- Multimodal approach to pain control In general your pain will be controlled with a combination of substances.  Prescriptions unless otherwise discussed are electronically sent to your pharmacy.  This is a carefully made plan we use to minimize narcotic use.                     -   Acetaminophen - Non-narcotic pain medicine taken on a scheduled basis        -   Hydrocodone - This is a strong narcotic, to be used only on an "as needed" basis for pain.                  -   Robaxin -  Muscle relaxer taken on an "as needed" basis for muscle spasms               STOP SMOKING OR USING NICOTINE PRODUCTS!!!!  As discussed nicotine severely impairs your body's ability to heal surgical and traumatic wounds but also impairs bone  healing.  Wounds and bone heal by forming microscopic blood vessels (angiogenesis) and nicotine is a vasoconstrictor (essentially, shrinks blood vessels).  Therefore, if vasoconstriction occurs to these microscopic blood vessels they essentially disappear and are unable to deliver necessary nutrients to the healing tissue.  This is one modifiable factor that you can do to dramatically increase your chances of healing your injury.    (This means no smoking, no nicotine gum, patches, etc)   FOLLOW-UP If you develop a Fever (>101.5), Redness or Drainage from the surgical incision site, please call our office to arrange for an evaluation. Please call the office to schedule a follow-up appointment for your incision check if you do not already have one, 7-10 days post-operatively.  CALL THE OFFICE WITH ANY QUESTIONS OR CONCERNS: [phone]   VISIT OUR WEBSITE FOR ADDITIONAL INFORMATION:  [email or web address]    OTHER HELPFUL INFORMATION  If you had a block, it will wear off between 8-24 hrs postop typically.  This is period when your pain may go from nearly zero to the pain you would have had postop without the block.  This is an abrupt transition but nothing dangerous is happening.  You may take an extra dose of narcotic when this happens.  You may be more comfortable sleeping in a semi-seated position the first few nights following surgery.  Keep a pillow propped under the elbow and forearm for comfort.  If you have a recliner type of chair it might be beneficial.  If not that is fine too, but it would be helpful to sleep propped up with pillows behind your operated shoulder as well under your elbow and forearm.  This will reduce pulling on the suture lines.  When dressing, put your operative arm in the sleeve first.  When getting undressed, take your operative arm out last.  Loose fitting, button-down shirts are recommended.  Often in the first days after surgery you may be more comfortable keeping your operative arm under your shirt and not through the sleeve.  You may return to work/school in the next couple of days when you feel up to it.  Desk work and typing in the sling is fine.  We suggest you use the pain medication the first night prior to going to bed, in order to ease any pain when the anesthesia wears off. You should avoid taking pain medications on an empty stomach as it will make you nauseous. You should wean off your narcotic medicines as soon as you are able.  Most patients will be off or using minimal narcotics before their first postop appointment.   Do not drink alcoholic beverages or take illicit drugs when taking pain medications.  It is against the law to drive while taking narcotics.  In some states it is against the law to drive while your arm is in a sling.   Pain medication may make you constipated.  Below are a few solutions to try in this  order: Decrease the amount of pain medication if you aren't having pain. Drink lots of decaffeinated fluids. Drink prune juice and/or each dried prunes  If the first 3 don't work start with additional solutions -   Take Colace - an over-the-counter stool softener -   Take Senokot - an over-the-counter laxative -   Take Miralax - a stronger over-the-counter laxative

## 2022-10-24 NOTE — Interval H&P Note (Signed)
History and Physical Interval Note:  10/24/2022 9:06 AM  Alejandro Cooper  has presented today for surgery, with the diagnosis of Right proximal humerus.  The various methods of treatment have been discussed with the patient and family. After consideration of risks, benefits and other options for treatment, the patient has consented to  Procedure(s): OPEN REDUCTION INTERNAL FIXATION (ORIF) PROXIMAL HUMERUS FRACTURE (Right) as a surgical intervention.  The patient's history has been reviewed, patient examined, no change in status, stable for surgery.  I have reviewed the patient's chart and labs.  Questions were answered to the patient's satisfaction.     [name]

## 2022-10-24 NOTE — Anesthesia Preprocedure Evaluation (Signed)
Anesthesia Evaluation  Patient identified by MRN, date of birth, ID band Patient awake    Reviewed: Allergy & Precautions, H&P , NPO status , Patient's Chart, lab work & pertinent test results  Airway Mallampati: II  TM Distance: >3 FB Neck ROM: Full    Dental no notable dental hx.    Pulmonary Current Smoker   Pulmonary exam normal breath sounds clear to auscultation       Cardiovascular negative cardio ROS Normal cardiovascular exam Rhythm:Regular Rate:Normal     Neuro/Psych negative neurological ROS  negative psych ROS   GI/Hepatic ,GERD  ,,(+)     substance abuse  alcohol use  Endo/Other  negative endocrine ROS    Renal/GU negative Renal ROS  negative genitourinary   Musculoskeletal negative musculoskeletal ROS (+)    Abdominal   Peds negative pediatric ROS (+)  Hematology negative hematology ROS (+)   Anesthesia Other Findings   Reproductive/Obstetrics negative OB ROS                             Anesthesia Physical Anesthesia Plan  ASA: 3  Anesthesia Plan: General   Post-op Pain Management: Regional block*   Induction: Intravenous  PONV Risk Score and Plan: 1 and Ondansetron, Dexamethasone and Treatment may vary due to age or medical condition  Airway Management Planned: Oral ETT  Additional Equipment:   Intra-op Plan:   Post-operative Plan: Extubation in OR  Informed Consent: I have reviewed the patients History and Physical, chart, labs and discussed the procedure including the risks, benefits and alternatives for the proposed anesthesia with the patient or authorized representative who has indicated his/her understanding and acceptance.     Dental advisory given  Plan Discussed with: CRNA and Surgeon  Anesthesia Plan Comments:        Anesthesia Quick Evaluation

## 2022-10-24 NOTE — Anesthesia Procedure Notes (Signed)
Anesthesia Procedure Image    
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
## 2022-10-27 NOTE — Anesthesia Postprocedure Evaluation (Signed)
Anesthesia Post Note  Patient: Alejandro Cooper  Procedure(s) Performed: OPEN REDUCTION INTERNAL FIXATION (ORIF) OF RIGHT PROXIMAL HUMERUS (Right: Arm Upper)     Patient location during evaluation: PACU Anesthesia Type: General Level of consciousness: awake and alert Pain management: pain level controlled Vital Signs Assessment: post-procedure vital signs reviewed and stable Respiratory status: spontaneous breathing, nonlabored ventilation, respiratory function stable and patient connected to nasal cannula oxygen Cardiovascular status: blood pressure returned to baseline and stable Postop Assessment: no apparent nausea or vomiting Anesthetic complications: no  No notable events documented.  Last Vitals:  Vitals:   10/24/22 1200 10/24/22 1215  BP: 133/87 (!) 128/92  Pulse: 83 86  Resp: 18 16  Temp:  36.6 C  SpO2: 98% 95%    Last Pain:  Vitals:   10/24/22 1215  PainSc: 4                  Izela Altier S

## 2022-10-31 ENCOUNTER — Ambulatory Visit (INDEPENDENT_AMBULATORY_CARE_PROVIDER_SITE_OTHER): Payer: Medicaid Other

## 2022-10-31 ENCOUNTER — Ambulatory Visit
Admission: EM | Admit: 2022-10-31 | Discharge: 2022-10-31 | Disposition: A | Discharge status: Home / Self Care | Payer: Medicaid Other | Attending: Physician Assistant | Admitting: Physician Assistant

## 2022-10-31 DIAGNOSIS — S3289XA Fracture of other parts of pelvis, initial encounter for closed fracture: Secondary | ICD-10-CM | POA: Diagnosis not present

## 2022-10-31 DIAGNOSIS — S8012XA Contusion of left lower leg, initial encounter: Secondary | ICD-10-CM | POA: Diagnosis not present
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
## 2022-10-31 NOTE — Discharge Instructions (Addendum)
-  Your x-ray shows that you have fractures of the pelvis. - I believe the area of swelling is likely a hematoma.  Given the size and the fact that it continues to enlarge, you really need to have an ultrasound performed and likely have this drained.  We cannot rule out a DVT or deep vein thrombosis/clot today but it seems less likely. - Follow-up with Ortho soon as possible. -Continue pain medication as needed.  Ice the area frequently, every couple of hours for about 10 or 15 minutes at a time. - Go to emergency department if sudden severe worsening of pain or swelling, redness of skin, fever, weakness or fall.  You have a condition requiring you to follow up with Orthopedics so please call one of the following office for appointment:   [facility] [address] [place], [place] [address] Phone: [phone]  [facility] [address], [place], [place] [address] Phone: [phone]
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
## 2023-01-13 ENCOUNTER — Ambulatory Visit (INDEPENDENT_AMBULATORY_CARE_PROVIDER_SITE_OTHER)
Admit: 2023-01-13 | Discharge: 2023-01-13 | Disposition: A | Discharge status: Home / Self Care | Payer: Medicaid Other | Attending: Physician Assistant | Admitting: Physician Assistant

## 2023-01-13 ENCOUNTER — Ambulatory Visit
Admission: EM | Admit: 2023-01-13 | Discharge: 2023-01-13 | Disposition: A | Discharge status: Home / Self Care | Payer: Medicaid Other | Attending: Physician Assistant | Admitting: Physician Assistant

## 2023-01-13 ENCOUNTER — Ambulatory Visit: Payer: Self-pay

## 2023-01-13 DIAGNOSIS — L03116 Cellulitis of left lower limb: Secondary | ICD-10-CM | POA: Diagnosis not present

## 2023-01-13 DIAGNOSIS — M7989 Other specified soft tissue disorders: Secondary | ICD-10-CM

## 2023-01-13 DIAGNOSIS — D72829 Elevated white blood cell count, unspecified: Secondary | ICD-10-CM | POA: Diagnosis not present

## 2023-01-13 LAB — CBC WITH DIFFERENTIAL/PLATELET
Abs Immature Granulocytes: 0.11 10*3/uL — ABNORMAL HIGH (ref 0.00–0.07)
Basophils Absolute: 0.1 10*3/uL (ref 0.0–0.1)
Basophils Relative: 0 %
Eosinophils Absolute: 0 10*3/uL (ref 0.0–0.5)
Eosinophils Relative: 0 %
HCT: 41.1 % (ref 39.0–52.0)
Hemoglobin: 13.9 g/dL (ref 13.0–17.0)
Immature Granulocytes: 1 %
Lymphocytes Relative: 6 %
Lymphs Abs: 1.1 10*3/uL (ref 0.7–4.0)
MCH: 30.2 pg (ref 26.0–34.0)
MCHC: 33.8 g/dL (ref 30.0–36.0)
MCV: 89.2 fL (ref 80.0–100.0)
Monocytes Absolute: 2 10*3/uL — ABNORMAL HIGH (ref 0.1–1.0)
Monocytes Relative: 12 %
Neutro Abs: 13.6 10*3/uL — ABNORMAL HIGH (ref 1.7–7.7)
Neutrophils Relative %: 81 %
Platelets: 349 10*3/uL (ref 150–400)
RBC: 4.61 MIL/uL (ref 4.22–5.81)
RDW: 13.6 % (ref 11.5–15.5)
WBC: 16.8 10*3/uL — ABNORMAL HIGH (ref 4.0–10.5)
nRBC: 0 % (ref 0.0–0.2)

## 2023-01-13 LAB — COMPREHENSIVE METABOLIC PANEL
ALT: 23 U/L (ref 0–44)
AST: 17 U/L (ref 15–41)
Albumin: 2.7 g/dL — ABNORMAL LOW (ref 3.5–5.0)
Alkaline Phosphatase: 120 U/L (ref 38–126)
Anion gap: 9 (ref 5–15)
BUN: 7 mg/dL (ref 6–20)
CO2: 26 mmol/L (ref 22–32)
Calcium: 8.3 mg/dL — ABNORMAL LOW (ref 8.9–10.3)
Chloride: 96 mmol/L — ABNORMAL LOW (ref 98–111)
Creatinine, Ser: 0.77 mg/dL (ref 0.61–1.24)
GFR, Estimated: >60 mL/min (ref >60)
Glucose, Bld: 76 mg/dL (ref 70–99)
Potassium: 3.2 mmol/L — ABNORMAL LOW (ref 3.5–5.1)
Sodium: 131 mmol/L — ABNORMAL LOW (ref 135–145)
Total Bilirubin: 0.4 mg/dL (ref 0.3–1.2)
Total Protein: 7.4 g/dL (ref 6.5–8.1)

## 2023-01-13 MED ORDER — CEPHALEXIN 500 MG PO CAPS: 500.0000 mg | ORAL_CAPSULE | Freq: Four times a day (QID) | ORAL | 0 refills | Status: DC

## 2023-01-13 MED ORDER — SULFAMETHOXAZOLE-TRIMETHOPRIM 800-160 MG PO TABS: 1.0000 | ORAL_TABLET | Freq: Two times a day (BID) | ORAL | 0 refills | Status: AC

## 2023-01-13 MED ORDER — CEFTRIAXONE SODIUM 1 G IJ SOLR
1.0000 g | Freq: Once | INTRAMUSCULAR | Status: AC
  Administered 2023-01-13: 1 g via INTRAMUSCULAR

## 2023-01-13 NOTE — Discharge Instructions (Signed)
It appears you have an infection in your leg/groin.  I am very concerned about how severe this is given your white blood cell count.  As we discussed, the safest thing to do is go to the emergency room.  Since you do not want to do that today we gave you injection of antibiotics.  Please start cephalexin 500 mg 4 times daily as well as sulfamethoxazole-trimethoprim twice daily for 7 days.  If you do not go to the emergency room you need to be reevaluated in 24 hours.  If you cannot see your primary care you can return here.  If anything changes or worsens and you have fever, nausea, vomiting, increasing pain, rapid spread of redness you need to go to the emergency room immediately.
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
## 2023-12-06 ENCOUNTER — Ambulatory Visit
Admission: EM | Admit: 2023-12-06 | Discharge: 2023-12-06 | Disposition: A | Discharge status: Home / Self Care | Payer: MEDICAID | Attending: Family Medicine | Admitting: Family Medicine

## 2023-12-06 DIAGNOSIS — Z599 Problem related to housing and economic circumstances, unspecified: Secondary | ICD-10-CM | POA: Diagnosis not present

## 2023-12-06 DIAGNOSIS — K047 Periapical abscess without sinus: Secondary | ICD-10-CM | POA: Diagnosis not present

## 2023-12-06 DIAGNOSIS — K029 Dental caries, unspecified: Secondary | ICD-10-CM

## 2023-12-06 MED ORDER — AMOXICILLIN-POT CLAVULANATE 875-125 MG PO TABS: 1.0000 | ORAL_TABLET | Freq: Two times a day (BID) | ORAL | 0 refills | Status: DC

## 2023-12-06 NOTE — ED Provider Notes (Signed)
 [facility]    CSN: [id number] Arrival date & time: 12/06/23  1258      History   Chief Complaint Chief Complaint  Patient presents with   Dental Pain    HPI MYKA LUKINS is a 33 y.o. male.   HPI  Swayze presents for left sided upper dental pain that started a couple days ago. He knows he needs to have dental surgery on that side.  Tried gargling with salt water, Goody powder and Oragel that isn't helping.  Has a hard time seeing a dentist.     Past Medical History:  Diagnosis Date   Anxiety    COVID 2020   mild case   Depression    GERD (gastroesophageal reflux disease)    Substance abuse ([facility])    alcohol abuse    Patient Active Problem List   Diagnosis Date Noted   Humerus fracture 10/07/2022    Past Surgical History:  Procedure Laterality Date   NO PAST SURGERIES     ORIF HUMERUS FRACTURE Right 10/24/2022   Procedure: OPEN REDUCTION INTERNAL FIXATION (ORIF) OF RIGHT PROXIMAL HUMERUS;  Surgeon: Kendal Franky SHAUNNA, MD;  Location: [facility];  Service: Orthopedics;  Laterality: Right;       Home Medications    Prior to Admission medications   Medication Sig Start Date End Date Taking? Authorizing Provider  amoxicillin -clavulanate (AUGMENTIN ) 875-125 MG tablet Take 1 tablet by mouth every 12 (twelve) hours. 12/06/23  Yes Yuta Cipollone, DO  ARIPiprazole (ABILIFY) 10 MG tablet Take 10 mg by mouth daily. 12/04/23  Yes [provider]  FLUoxetine (PROZAC) 20 MG capsule Take 20 mg by mouth daily. 12/03/23  Yes [provider]  gabapentin  (NEURONTIN ) 600 MG tablet Take 600 mg by mouth 3 (three) times daily. 12/03/23  Yes [provider]  acetaminophen  (TYLENOL ) 500 MG tablet Take 2 tablets (1,000 mg total) by mouth every 6 (six) hours as needed for mild pain or moderate pain. 10/08/22   Augustus Almarie RAMAN, PA-C  HYDROcodone -acetaminophen  (NORCO) 7.5-325 MG tablet Take 1 tablet by mouth every 4 (four) hours as needed for moderate pain or  severe pain. Rx sent 10/22/22 10/24/22   Danton Lauraine LABOR, PA-C  methocarbamol  (ROBAXIN ) 500 MG tablet Take 1 tablet (500 mg total) by mouth every 6 (six) hours as needed for muscle spasms. 10/24/22   Danton Lauraine LABOR, PA-C    Family History History reviewed. No pertinent family history.  Social History Social History   Tobacco Use   Smoking status: Every Day    Types: Cigarettes   Smokeless tobacco: Never  Vaping Use   Vaping status: Some Days  Substance Use Topics   Alcohol use: Yes    Comment: Beer - before accident 4-6 24oz cans   Drug use: Never     Allergies   Patient has no known allergies.   Review of Systems Review of Systems : negative unless otherwise stated in HPI.      Physical Exam Triage Vital Signs ED Triage Vitals  Encounter Vitals Group     BP 12/06/23 1336 (!) 141/91     Systolic BP Percentile --      Diastolic BP Percentile --      Pulse Rate 12/06/23 1336 92     Resp 12/06/23 1336 16     Temp 12/06/23 1336 99.3 F (37.4 C)     Temp Source 12/06/23 1336 Oral     SpO2 12/06/23 1336 96 %  Weight --      Height --      Head Circumference --      Peak Flow --      Pain Score 12/06/23 1335 8     Pain Loc --      Pain Education --      Exclude from Growth Chart --    No data found.  Updated Vital Signs BP (!) 141/91 (BP Location: Right Arm)   Pulse 92   Temp 99.3 F (37.4 C) (Oral)   Resp 16   SpO2 96%   Visual Acuity Right Eye Distance:   Left Eye Distance:   Bilateral Distance:    Right Eye Near:   Left Eye Near:    Bilateral Near:     Physical Exam  GEN:     alert, uncomfortable appearing male in no distress    HENT:  mucus membranes moist, oropharyngeal without lesions exudates or erythema, nasal discharge, posterior left 1st molar tender to percussion, poor dentition, multiple crack offs and dental caries, no trismus, no secretion pooling, no palpable induration and no visible swelling of the floor the mouth, normal jaw  movement without difficulty EYES:   pupils equal and reactive, no scleral injection or discharge NECK:  normal ROM, no lymphadenopathy, no meningismus   RESP:  no increased work of breathing CVS:   regular rate  Skin:   warm and dry, no rash or skin changes of on external jaw    UC Treatments / Results  Labs (all labs ordered are listed, but only abnormal results are displayed) Labs Reviewed - No data to display  EKG   Radiology No results found.  Procedures Procedures (including critical care time)  Medications Ordered in UC Medications - No data to display  Initial Impression / Assessment and Plan / UC Course  I have reviewed the triage vital signs and the nursing notes.  Pertinent labs & imaging results that were available during my care of the patient were reviewed by me and considered in my medical decision making (see chart for details).     Pt is a 33 y.o. male who has history of polysubstance abuse presents for 2 days of dental pain. Butler is afebrile here without recent antipyretics. Satting well on room air. Overall pt is uncomfortable appearing, well hydrated, without respiratory distress.  Dental exam concerning for poor dentition with multiple Crack offs and dental caries and developing dental abscess.  Treat with Augmentin  as below. - continue Tylenol  with Motrin  as needed for discomfort - Gargle with salt water several times a day -Dental resource handout provided  - Discussed  ED precautions, understanding voiced.   Discussed MDM, treatment plan and plan for follow-up with patient who agrees with plan.   Final Clinical Impressions(s) / UC Diagnoses   Final diagnoses:  Infected dental caries  Pain due to dental caries  Financial difficulties     Discharge Instructions      At this time there is no abscess to be drained. You were prescribed an antibiotic. Please take this exactly as directed and do not stop taking it until the entire course of  medicine is finished, even if you begin to feel better before finishing the course.  Schedule an appointment with your dentist or call local dentists to see if they take your insurance or can do a payment payment plan.  [facility] [employer] [facility] are options. Consider [facility] prior to your dental  appointment.   Consider the [facility] dental school  free clinic operates from 6-9 pm on select Wednesdays:  [facility] Ground Floor, [place] [address] [place], [place] [address] Parking Location: Suszanne Spurr           ED Prescriptions     Medication Sig Dispense Auth. Provider   amoxicillin -clavulanate (AUGMENTIN ) 875-125 MG tablet Take 1 tablet by mouth every 12 (twelve) hours. 14 tablet Marshella Tello, DO      PDMP not reviewed this encounter.   Sibyl Mikula, DO 12/06/23 1507

## 2023-12-06 NOTE — ED Triage Notes (Signed)
Dental pain on left side.

## 2023-12-06 NOTE — Discharge Instructions (Signed)
 At this time there is no abscess to be drained. You were prescribed an antibiotic. Please take this exactly as directed and do not stop taking it until the entire course of medicine is finished, even if you begin to feel better before finishing the course.  Schedule an appointment with your dentist or call local dentists to see if they take your insurance or can do a payment payment plan.  [facility] [employer] [facility] are options. Consider [facility] prior to your dental appointment.   Consider the [facility] dental school  free clinic operates from 6-9 pm on select Wednesdays:  [facility] [employer], [place] [address] [place], [place] [address] Parking Location: Barrie Dunker

## 2023-12-17 ENCOUNTER — Telehealth: Payer: Self-pay

## 2023-12-17 NOTE — Telephone Encounter (Signed)
 Received message for meds to be sent to [facility]. Attempted to call patient to clarify what meds needed to be sent. Patient hasn't been seen since 12/06/23.

## 2024-01-05 ENCOUNTER — Emergency Department
Admission: EM | Admit: 2024-01-05 | Discharge: 2024-01-05 | Disposition: A | Discharge status: Home / Self Care | Payer: MEDICAID | Attending: Emergency Medicine | Admitting: Emergency Medicine

## 2024-01-05 ENCOUNTER — Emergency Department: Payer: MEDICAID

## 2024-01-05 DIAGNOSIS — Y92410 Unspecified street and highway as the place of occurrence of the external cause: Secondary | ICD-10-CM | POA: Diagnosis not present

## 2024-01-05 DIAGNOSIS — M25511 Pain in right shoulder: Secondary | ICD-10-CM | POA: Insufficient documentation

## 2024-01-05 MED ORDER — HYDROCODONE-ACETAMINOPHEN 5-325 MG PO TABS: 1.0000 | ORAL_TABLET | ORAL | 0 refills | Status: AC | PRN

## 2024-01-05 MED ORDER — ACETAMINOPHEN 500 MG PO TABS
1000.0000 mg | ORAL_TABLET | Freq: Once | ORAL | Status: DC
  Filled 2024-01-05: qty 2
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
## 2024-01-05 NOTE — Discharge Instructions (Addendum)
 Please wear your shoulder immobilizer.  Please follow-up with orthopedics by calling the number provided to arrange a follow-up appointment approximately 1 week.  Please take your pain medication as needed but only as prescribed.  Do not drink alcohol or drive while taking this medication.  Return to the emergency department for any symptom personally concerning to yourself.

## 2024-11-24 ENCOUNTER — Ambulatory Visit
Admission: EM | Admit: 2024-11-24 | Discharge: 2024-11-24 | Disposition: A | Discharge status: Home / Self Care | Payer: MEDICAID | Attending: Family Medicine | Admitting: Family Medicine

## 2024-11-24 DIAGNOSIS — K029 Dental caries, unspecified: Secondary | ICD-10-CM

## 2024-11-24 DIAGNOSIS — Z599 Problem related to housing and economic circumstances, unspecified: Secondary | ICD-10-CM

## 2024-11-24 DIAGNOSIS — K0889 Other specified disorders of teeth and supporting structures: Secondary | ICD-10-CM

## 2024-11-24 DIAGNOSIS — R030 Elevated blood-pressure reading, without diagnosis of hypertension: Secondary | ICD-10-CM

## 2024-11-24 DIAGNOSIS — K047 Periapical abscess without sinus: Secondary | ICD-10-CM

## 2024-11-24 MED ORDER — AMOXICILLIN-POT CLAVULANATE 875-125 MG PO TABS: 1.0000 | ORAL_TABLET | Freq: Two times a day (BID) | ORAL | 0 refills | Status: AC

## 2024-11-24 NOTE — ED Triage Notes (Signed)
 Upper left dental pain onset 4 days ago. No oral injury or known broken teeth.   Patient tried BC powder, ibuprofen , and ice with no relief.

## 2024-11-24 NOTE — Discharge Instructions (Addendum)
 At this time there is no abscess to be drained. You were prescribed an antibiotic. Please take this exactly as directed and do not stop taking it until the entire course of medicine is finished, even if you begin to feel better before finishing the course.  Schedule an appointment with your dentist or call local dentists to see if they take your insurance or can do a payment payment plan.  [facility] [employer] [facility] are options. Consider [facility] prior to your dental appointment.   Consider the [facility] dental school  free clinic operates from 6-9 pm on select Wednesdays:  [facility] [employer], [place] [address] [place], [place] [address] Parking Location: Barrie Dunker

## 2024-11-24 NOTE — ED Provider Notes (Signed)
 " [facility]    CSN: [id number] Arrival date & time: 11/24/24  1933      History   Chief Complaint Chief Complaint  Patient presents with   Dental Pain    HPI Alejandro Cooper is a 34 y.o. male.   HPI  [name] presents for left upper dental pain that started 4 days ago.  Has been taking ibuprofen , BC powder and applying ice to his jaw without relief.  He has known dental issues at this area and was told previously that he needs to have dental surgery.  Notes that he has a hard time seeing a dentist      Past Medical History:  Diagnosis Date   Anxiety    COVID 2020   mild case   Depression    GERD (gastroesophageal reflux disease)    Substance abuse (HCC)    alcohol abuse    Patient Active Problem List   Diagnosis Date Noted   Humerus fracture 10/07/2022    Past Surgical History:  Procedure Laterality Date   NO PAST SURGERIES     ORIF HUMERUS FRACTURE Right 10/24/2022   Procedure: OPEN REDUCTION INTERNAL FIXATION (ORIF) OF RIGHT PROXIMAL HUMERUS;  Surgeon: Kendal Franky SHAUNNA, MD;  Location: [facility] OR;  Service: Orthopedics;  Laterality: Right;       Home Medications    Prior to Admission medications  Medication Sig Start Date End Date Taking? Authorizing Provider  acetaminophen  (TYLENOL ) 500 MG tablet Take 2 tablets (1,000 mg total) by mouth every 6 (six) hours as needed for mild pain or moderate pain. 10/08/22   Simaan, Elizabeth S, PA-C  amoxicillin -clavulanate (AUGMENTIN ) 875-125 MG tablet Take 1 tablet by mouth every 12 (twelve) hours. 11/24/24   Wm Fruchter, DO  ARIPiprazole (ABILIFY) 10 MG tablet Take 10 mg by mouth daily. 12/04/23   [provider]  FLUoxetine (PROZAC) 20 MG capsule Take 20 mg by mouth daily. 12/03/23   [provider]  gabapentin  (NEURONTIN ) 600 MG tablet Take 600 mg by mouth 3 (three) times daily. 12/03/23   [provider]  HYDROcodone -acetaminophen  (NORCO/VICODIN) 5-325 MG tablet Take 1 tablet by mouth  every 4 (four) hours as needed. 01/05/24   Dorothyann Franky, MD  methocarbamol  (ROBAXIN ) 500 MG tablet Take 1 tablet (500 mg total) by mouth every 6 (six) hours as needed for muscle spasms. 10/24/22   Danton Lauraine LABOR, PA-C    Family History History reviewed. No pertinent family history.  Social History Social History[1]   Allergies   Patient has no known allergies.   Review of Systems Review of Systems : negative unless otherwise stated in HPI.      Physical Exam Triage Vital Signs ED Triage Vitals  Encounter Vitals Group     BP      Girls Systolic BP Percentile      Girls Diastolic BP Percentile      Boys Systolic BP Percentile      Boys Diastolic BP Percentile      Pulse      Resp      Temp      Temp src      SpO2      Weight      Height      Head Circumference      Peak Flow      Pain Score      Pain Loc      Pain Education      Exclude from Growth Chart  No data found.  Updated Vital Signs BP (!) 156/100 (BP Location: Left Arm)   Pulse 68   Temp 98.7 F (37.1 C) (Oral)   Resp 18   SpO2 96%   Visual Acuity Right Eye Distance:   Left Eye Distance:   Bilateral Distance:    Right Eye Near:   Left Eye Near:    Bilateral Near:     Physical Exam  GEN:     alert, uncomfortable appearing male in no distress    HENT:  mucus membranes moist, oropharyngeal without lesions exudates or erythema, nasal discharge, left upper 1st molar tender to percussion, no trismus, no secretion pooling, + palpable induration and no visible swelling of the floor the mouth; normal jaw movement without difficulty EYES:   pupils equal and reactive, no scleral injection or discharge NECK:  normal ROM, no lymphadenopathy, no meningismus   RESP:  no increased work of breathing, clear bilaterally  CVS:   regular rate and rhythm   Skin:   warm and dry, no rash or skin changes of on external jaw    UC Treatments / Results  Labs (all labs ordered are listed, but only  abnormal results are displayed) Labs Reviewed - No data to display  EKG   Radiology No results found.  Procedures Procedures (including critical care time)  Medications Ordered in UC Medications - No data to display  Initial Impression / Assessment and Plan / UC Course  I have reviewed the triage vital signs and the nursing notes.  Pertinent labs & imaging results that were available during my care of the patient were reviewed by me and considered in my medical decision making (see chart for details).     Pt is a 34 y.o. male  who has history of polysubstance abuse presents for 2 days of dental pain. [name] is afebrile here without recent antipyretics. Satting well on room air.   He is hypertensive here 178/122.  His recheck blood pressure is 156/100. Elevated blood pressure complicated by pain.    Overall pt is uncomfortable appearing, well hydrated, without respiratory distress.  Dental exam concerning for poor dentition with multiple Crack offs and dental caries and developing dental abscess.  Treat with Augmentin  as below. - continue Tylenol  with Motrin  as needed for discomfort - Gargle with salt water several times a day -Dental resource handout provided  - Discussed  ED precautions, understanding voiced.    Discussed MDM, treatment plan and plan for follow-up with patient who agrees with plan.   Final Clinical Impressions(s) / UC Diagnoses   Final diagnoses:  Infected dental caries  Pain, dental  Financial difficulties  Elevated blood pressure reading     Discharge Instructions      At this time there is no abscess to be drained. You were prescribed an antibiotic. Please take this exactly as directed and do not stop taking it until the entire course of medicine is finished, even if you begin to feel better before finishing the course.  Schedule an appointment with your dentist or call local dentists to see if they take your insurance or can do a payment payment  plan.  [facility] and [employer] [facility] are options. Consider [facility] prior to your dental appointment.    Consider the [facility] dental school  free clinic operates from 6-9 pm on select Wednesdays:  [facility] Ground Floor, [place] [address] [place], [place] [address] Parking Location: [place]  [place]       ED Prescriptions     Medication Sig Dispense Auth. Provider   amoxicillin -clavulanate (AUGMENTIN ) 875-125 MG tablet Take 1 tablet by mouth every 12 (twelve) hours. 14 tablet Yolinda Duerr, DO      PDMP not reviewed this encounter.     [1]  Social History Tobacco Use   Smoking status: Every Day    Types: Cigarettes   Smokeless tobacco: Never  Vaping Use   Vaping status: Some Days  Substance Use Topics   Alcohol use: Yes    Comment: Beer - before accident 4-6 24oz cans   Drug use: Never     Kriste Berth, DO 12/02/24 1714  "

## 2024-11-25 ENCOUNTER — Ambulatory Visit: Payer: Self-pay
